# Patient Record
Sex: Male | Born: 2005 | Race: White | Hispanic: No | Marital: Single | Smoking: Never smoker
Health system: Southern US, Community
[De-identification: ages and names within clinical notes are randomized; demographics above are authoritative.]

## PROBLEM LIST (undated history)

## (undated) DIAGNOSIS — J45909 Unspecified asthma, uncomplicated: Secondary | ICD-10-CM

---
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
## 2023-11-27 ENCOUNTER — Ambulatory Visit: Payer: Commercial Managed Care - HMO | Admitting: Family Medicine

## 2023-11-27 NOTE — Progress Notes (Deleted)
   New Patient Office Visit  Subjective   Patient ID: Richard Fletcher, male    DOB: 2006-03-25  Age: 17 y.o. MRN: [id number]  CC: No chief complaint on file.   HPI Richard Fletcher presents to establish care ***  PMH: ***  PSH: ***  FH: ***  Tobacco use: *** Alcohol use: *** Drug use: *** Marital status: *** Employment: *** Sexual hx: ***  Screenings:  Colon Cancer: *** Lung Cancer: *** Breast Cancer: *** Diabetes: *** HLD: ***   Outpatient Encounter Medications as of 11/27/2023  Medication Sig   ibuprofen (ADVIL,MOTRIN) 200 MG tablet Take 200 mg by mouth every 6 (six) hours as needed.   No facility-administered encounter medications on file as of 11/27/2023.    Past Medical History:  Diagnosis Date   Asthma     No past surgical history on file.  No family history on file.  Social History   Socioeconomic History   Marital status: Single    Spouse name: Not on file   Number of children: Not on file   Years of education: Not on file   Highest education level: Not on file  Occupational History   Not on file  Tobacco Use   Smoking status: Never   Smokeless tobacco: Not on file  Substance and Sexual Activity   Alcohol use: No   Drug use: No   Sexual activity: Not on file  Other Topics Concern   Not on file  Social History Narrative   Not on file   Social Drivers of Health   Financial Resource Strain: Not on file  Food Insecurity: Not on file  Transportation Needs: Not on file  Physical Activity: Not on file  Stress: Not on file  Social Connections: Not on file  Intimate Partner Violence: Not on file    ROS     Objective   There were no vitals taken for this visit.  Physical Exam     Assessment & Plan:   There are no diagnoses linked to this encounter.  No follow-ups on file.   Sandre Kitty, MD

## 2024-01-04 ENCOUNTER — Ambulatory Visit: Payer: Commercial Managed Care - HMO | Admitting: Family Medicine

## 2024-01-04 NOTE — Progress Notes (Deleted)
   New Patient Office Visit  Subjective   Patient ID: Richard Fletcher, male    DOB: Apr 25, 2006  Age: 18 y.o. MRN: [id number]  CC: No chief complaint on file.   HPI Richard Fletcher presents to establish care ***  PMH: ***  PSH: ***  FH: ***  Tobacco use: *** Alcohol use: *** Drug use: *** Marital status: *** Employment: *** Sexual hx: ***  Screenings:  Colon Cancer: *** Lung Cancer: *** Breast Cancer: *** Diabetes: *** HLD: ***   Outpatient Encounter Medications as of 01/04/2024  Medication Sig   ibuprofen (ADVIL,MOTRIN) 200 MG tablet Take 200 mg by mouth every 6 (six) hours as needed.   No facility-administered encounter medications on file as of 01/04/2024.    Past Medical History:  Diagnosis Date   Asthma     No past surgical history on file.  No family history on file.  Social History   Socioeconomic History   Marital status: Single    Spouse name: Not on file   Number of children: Not on file   Years of education: Not on file   Highest education level: Not on file  Occupational History   Not on file  Tobacco Use   Smoking status: Never   Smokeless tobacco: Not on file  Substance and Sexual Activity   Alcohol use: No   Drug use: No   Sexual activity: Not on file  Other Topics Concern   Not on file  Social History Narrative   Not on file   Social Drivers of Health   Financial Resource Strain: Not on file  Food Insecurity: Not on file  Transportation Needs: Not on file  Physical Activity: Not on file  Stress: Not on file  Social Connections: Not on file  Intimate Partner Violence: Not on file    ROS     Objective   There were no vitals taken for this visit.  Physical Exam     Assessment & Plan:   There are no diagnoses linked to this encounter.  No follow-ups on file.   Sandre Kitty, MD
